# Patient Record
Sex: Female | Born: 1995
Health system: Southern US, Community
[De-identification: ages and names within clinical notes are randomized; demographics above are authoritative.]

---
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
## 2015-07-01 ENCOUNTER — Emergency Department (HOSPITAL_BASED_OUTPATIENT_CLINIC_OR_DEPARTMENT_OTHER)
Admission: EM | Admit: 2015-07-01 | Discharge: 2015-07-01 | Disposition: A | Discharge status: Home / Self Care | Payer: Medicaid Other | Attending: Emergency Medicine | Admitting: Emergency Medicine

## 2015-07-01 ENCOUNTER — Encounter (HOSPITAL_BASED_OUTPATIENT_CLINIC_OR_DEPARTMENT_OTHER): Payer: Self-pay | Admitting: Emergency Medicine

## 2015-07-01 DIAGNOSIS — Z791 Long term (current) use of non-steroidal anti-inflammatories (NSAID): Secondary | ICD-10-CM | POA: Insufficient documentation

## 2015-07-01 DIAGNOSIS — Z87891 Personal history of nicotine dependence: Secondary | ICD-10-CM | POA: Insufficient documentation

## 2015-07-01 DIAGNOSIS — M542 Cervicalgia: Secondary | ICD-10-CM | POA: Insufficient documentation

## 2015-07-01 DIAGNOSIS — Z79899 Other long term (current) drug therapy: Secondary | ICD-10-CM | POA: Insufficient documentation

## 2015-07-01 MED ORDER — IBUPROFEN 400 MG PO TABS
400.0000 mg | ORAL_TABLET | Freq: Once | ORAL | Status: AC
  Administered 2015-07-01: 400 mg via ORAL
  Filled 2015-07-01: qty 1

## 2015-07-01 NOTE — ED Notes (Signed)
Patient states that she has had "back problems for a while"  - unable to tell this RN for how long. Today however she started to have sharp pains in her left upper neck to shoulder region

## 2015-07-01 NOTE — ED Provider Notes (Signed)
CSN: [id number]     Arrival date & time 07/01/15  2043 History   First MD Initiated Contact with Patient 07/01/15 2115     Chief Complaint  Patient presents with  . Neck Pain     (Consider location/radiation/quality/duration/timing/severity/associated sxs/prior Treatment) HPI  Blood pressure 123/77, pulse 73, temperature 98.2 F (36.8 C), temperature source Oral, resp. rate 16, height  (1.626 m), weight 139 lb (63.05 kg), last menstrual period 07/01/2015, SpO2 100 %.  Lindsay Shah is a 19 y.o. female complaining of left-sided neck pain which states that she's had intermittently and chronically but had an exacerbation this morning. There was no trauma. She describes it as sharp and radiating to the occipital area. She denies headache, fever, chills, pain with neck movement. She rates her pain at 8 out of 10 in no pain medication was taken prior to arrival  History reviewed. No pertinent past medical history. History reviewed. No pertinent past surgical history. History reviewed. No pertinent family history. Social History  Substance Use Topics  . Smoking status: Former Games developer  . Smokeless tobacco: None  . Alcohol Use: No   OB History    No data available     Review of Systems  10 systems reviewed and found to be negative, except as noted in the HPI.   Allergies  Pineapple  Home Medications   Prior to Admission medications   Medication Sig Start Date End Date Taking? Authorizing Provider  famotidine (PEPCID) 20 MG tablet Take 1 tablet (20 mg total) by mouth 2 (two) times daily. 05/02/14   Vanetta Mulders, MD  naproxen (NAPROSYN) 500 MG tablet Take 1 tablet (500 mg total) by mouth 2 (two) times daily. 05/02/14   Vanetta Mulders, MD   BP 123/77 mmHg  Pulse 73  Temp(Src) 98.2 F (36.8 C) (Oral)  Resp 16  Ht  (1.626 m)  Wt 139 lb (63.05 kg)  BMI 23.85 kg/m2  SpO2 100%  LMP 07/01/2015 Physical Exam  Constitutional: She is oriented to person, place, and  time. She appears well-developed and well-nourished.  HENT:  Head: Normocephalic and atraumatic.  Mouth/Throat: Oropharynx is clear and moist.  Eyes: Conjunctivae and EOM are normal. Pupils are equal, round, and reactive to light.  No TTP of maxillary or frontal sinuses  No TTP or induration of temporal arteries bilaterally  Neck: Normal range of motion. Neck supple.  FROM to C-spine. Pt can touch chin to chest without discomfort. No TTP of midline cervical spine.   Cardiovascular: Normal rate, regular rhythm and intact distal pulses.   Pulmonary/Chest: Effort normal and breath sounds normal. No respiratory distress. She has no wheezes. She has no rales. She exhibits no tenderness.  Abdominal: Soft. Bowel sounds are normal. There is no tenderness.  Musculoskeletal: Normal range of motion. She exhibits no edema or tenderness.       Back:  Neurological: She is alert and oriented to person, place, and time. No cranial nerve deficit.  Follows commands, Clear, goal oriented speech, Strength is 5 out of 5x4 extremities, patient ambulates with a coordinated in nonantalgic gait. Sensation is grossly intact.    Nursing note and vitals reviewed.   ED Course  Procedures (including critical care time) Labs Review Labs Reviewed - No data to display  Imaging Review No results found. I have personally reviewed and evaluated these images and lab results as part of my medical decision-making.   EKG Interpretation None      MDM   Final diagnoses:  Cervical pain (neck)    Filed Vitals:   07/01/15 2047  BP: 123/77  Pulse: 73  Temp: 98.2 F (36.8 C)  TempSrc: Oral  Resp: 16  Height: 5\' 4"  (1.626 m)  Weight: 139 lb (63.05 kg)  SpO2: 100%    Medications  ibuprofen (ADVIL,MOTRIN) tablet 400 mg (400 mg Oral Given 07/01/15 2127)    [name] is 19 y.o. female presenting with point tenderness to palpation on left upper back. No cervical stiffness or meningeal signs, patient  afebrile. Advised patient to take high-dose Motrin and follow closely with primary care physician.  Evaluation does not show pathology that would require ongoing emergent intervention or inpatient treatment. Pt is hemodynamically stable and mentating appropriately. Discussed findings and plan with patient/guardian, who agrees with care plan. All questions answered. Return precautions discussed and outpatient follow up given.      Wynetta Emeryicole Wyn [place]ettle, PA-C 07/02/15 0008  Glynn OctaveStephen Rancour, MD 07/02/15 Moses [id number]

## 2015-07-01 NOTE — Discharge Instructions (Signed)
For pain control please take ibuprofen (also known as Motrin or Advil)  (this is normally 4 over the counter pills) 3 times a day  for 5 days. Take with food to minimize stomach irritation.  Do not hesitate to return to the emergency room for any new, worsening or concerning symptoms.  Please obtain primary care using resource guide below. Let them know that you were seen in the emergency room and that they will need to obtain records for further outpatient management.  Cervical Strain and Sprain With Rehab Cervical strain and sprain are injuries that commonly occur with "whiplash" injuries. Whiplash occurs when the neck is forcefully whipped backward or forward, such as during a motor vehicle accident or during contact sports. The muscles, ligaments, tendons, discs, and nerves of the neck are susceptible to injury when this occurs. RISK FACTORS Risk of having a whiplash injury increases if:  Osteoarthritis of the spine.  Situations that make head or neck accidents or trauma more likely.  High-risk sports (football, rugby, wrestling, hockey, auto racing, gymnastics, diving, contact karate, or boxing).  Poor strength and flexibility of the neck.  Previous neck injury.  Poor tackling technique.  Improperly fitted or padded equipment. SYMPTOMS   Pain or stiffness in the front or back of neck or both.  Symptoms may present immediately or up to 24 hours after injury.  Dizziness, headache, nausea, and vomiting.  Muscle spasm with soreness and stiffness in the neck.  Tenderness and swelling at the injury site. PREVENTION  Learn and use proper technique (avoid tackling with the head, spearing, and head-butting; use proper falling techniques to avoid landing on the head).  Warm up and stretch properly before activity.  Maintain physical fitness:  Strength, flexibility, and endurance.  Cardiovascular fitness.  Wear properly fitted and padded protective equipment, such as  padded soft collars, for participation in contact sports. PROGNOSIS  Recovery from cervical strain and sprain injuries is dependent on the extent of the injury. These injuries are usually curable in 1 week to 3 months with appropriate treatment.  RELATED COMPLICATIONS   Temporary numbness and weakness may occur if the nerve roots are damaged, and this may persist until the nerve has completely healed.  Chronic pain due to frequent recurrence of symptoms.  Prolonged healing, especially if activity is resumed too soon (before complete recovery). TREATMENT  Treatment initially involves the use of ice and medication to help reduce pain and inflammation. It is also important to perform strengthening and stretching exercises and modify activities that worsen symptoms so the injury does not get worse. These exercises may be performed at home or with a therapist. For patients who experience severe symptoms, a soft, padded collar may be recommended to be worn around the neck.  Improving your posture may help reduce symptoms. Posture improvement includes pulling your chin and abdomen in while sitting or standing. If you are sitting, sit in a firm chair with your buttocks against the back of the chair. While sleeping, try replacing your pillow with a small towel rolled to 2 inches in diameter, or use a cervical pillow or soft cervical collar. Poor sleeping positions delay healing.  For patients with nerve root damage, which causes numbness or weakness, the use of a cervical traction apparatus may be recommended. Surgery is rarely necessary for these injuries. However, cervical strain and sprains that are present at birth (congenital) may require surgery. MEDICATION   If pain medication is necessary, nonsteroidal anti-inflammatory medications, such as aspirin and ibuprofen,  or other minor pain relievers, such as acetaminophen, are often recommended.  Do not take pain medication for 7 days before  surgery.  Prescription pain relievers may be given if deemed necessary by your caregiver. Use only as directed and only as much as you need. HEAT AND COLD:   Cold treatment (icing) relieves pain and reduces inflammation. Cold treatment should be applied for 10 to 15 minutes every 2 to 3 hours for inflammation and pain and immediately after any activity that aggravates your symptoms. Use ice packs or an ice massage.  Heat treatment may be used prior to performing the stretching and strengthening activities prescribed by your caregiver, physical therapist, or athletic trainer. Use a heat pack or a warm soak. SEEK MEDICAL CARE IF:   Symptoms get worse or do not improve in 2 weeks despite treatment.  New, unexplained symptoms develop (drugs used in treatment may produce side effects). EXERCISES RANGE OF MOTION (ROM) AND STRETCHING EXERCISES - Cervical Strain and Sprain These exercises may help you when beginning to rehabilitate your injury. In order to successfully resolve your symptoms, you must improve your posture. These exercises are designed to help reduce the forward-head and rounded-shoulder posture which contributes to this condition. Your symptoms may resolve with or without further involvement from your physician, physical therapist or athletic trainer. While completing these exercises, remember:   Restoring tissue flexibility helps normal motion to return to the joints. This allows healthier, less painful movement and activity.  An effective stretch should be held for at least 20 seconds, although you may need to begin with shorter hold times for comfort.  A stretch should never be painful. You should only feel a gentle lengthening or release in the stretched tissue. STRETCH- Axial Extensors  Lie on your back on the floor. You may bend your knees for comfort. Place a rolled-up hand towel or dish towel, about 2 inches in diameter, under the part of your head that makes contact with the  floor.  Gently tuck your chin, as if trying to make a "double chin," until you feel a gentle stretch at the base of your head.  Hold __________ seconds. Repeat __________ times. Complete this exercise __________ times per day.  STRETCH - Axial Extension   Stand or sit on a firm surface. Assume a good posture: chest up, shoulders drawn back, abdominal muscles slightly tense, knees unlocked (if standing) and feet hip width apart.  Slowly retract your chin so your head slides back and your chin slightly lowers. Continue to look straight ahead.  You should feel a gentle stretch in the back of your head. Be certain not to feel an aggressive stretch since this can cause headaches later.  Hold for __________ seconds. Repeat __________ times. Complete this exercise __________ times per day. STRETCH - Cervical Side Bend   Stand or sit on a firm surface. Assume a good posture: chest up, shoulders drawn back, abdominal muscles slightly tense, knees unlocked (if standing) and feet hip width apart.  Without letting your nose or shoulders move, slowly tip your right / left ear to your shoulder until your feel a gentle stretch in the muscles on the opposite side of your neck.  Hold __________ seconds. Repeat __________ times. Complete this exercise __________ times per day. STRETCH - Cervical Rotators   Stand or sit on a firm surface. Assume a good posture: chest up, shoulders drawn back, abdominal muscles slightly tense, knees unlocked (if standing) and feet hip width apart.  Keeping your  eyes level with the ground, slowly turn your head until you feel a gentle stretch along the back and opposite side of your neck.  Hold __________ seconds. Repeat __________ times. Complete this exercise __________ times per day. RANGE OF MOTION - Neck Circles   Stand or sit on a firm surface. Assume a good posture: chest up, shoulders drawn back, abdominal muscles slightly tense, knees unlocked (if standing) and  feet hip width apart.  Gently roll your head down and around from the back of one shoulder to the back of the other. The motion should never be forced or painful.  Repeat the motion 10-20 times, or until you feel the neck muscles relax and loosen. Repeat __________ times. Complete the exercise __________ times per day. STRENGTHENING EXERCISES - Cervical Strain and Sprain These exercises may help you when beginning to rehabilitate your injury. They may resolve your symptoms with or without further involvement from your physician, physical therapist, or athletic trainer. While completing these exercises, remember:   Muscles can gain both the endurance and the strength needed for everyday activities through controlled exercises.  Complete these exercises as instructed by your physician, physical therapist, or athletic trainer. Progress the resistance and repetitions only as guided.  You may experience muscle soreness or fatigue, but the pain or discomfort you are trying to eliminate should never worsen during these exercises. If this pain does worsen, stop and make certain you are following the directions exactly. If the pain is still present after adjustments, discontinue the exercise until you can discuss the trouble with your clinician. STRENGTH - Cervical Flexors, Isometric  Face a wall, standing about 6 inches away. Place a small pillow, a ball about 6-8 inches in diameter, or a folded towel between your forehead and the wall.  Slightly tuck your chin and gently push your forehead into the soft object. Push only with mild to moderate intensity, building up tension gradually. Keep your jaw and forehead relaxed.  Hold 10 to 20 seconds. Keep your breathing relaxed.  Release the tension slowly. Relax your neck muscles completely before you start the next repetition. Repeat __________ times. Complete this exercise __________ times per day. STRENGTH- Cervical Lateral Flexors, Isometric   Stand  about 6 inches away from a wall. Place a small pillow, a ball about 6-8 inches in diameter, or a folded towel between the side of your head and the wall.  Slightly tuck your chin and gently tilt your head into the soft object. Push only with mild to moderate intensity, building up tension gradually. Keep your jaw and forehead relaxed.  Hold 10 to 20 seconds. Keep your breathing relaxed.  Release the tension slowly. Relax your neck muscles completely before you start the next repetition. Repeat __________ times. Complete this exercise __________ times per day. STRENGTH - Cervical Extensors, Isometric   Stand about 6 inches away from a wall. Place a small pillow, a ball about 6-8 inches in diameter, or a folded towel between the back of your head and the wall.  Slightly tuck your chin and gently tilt your head back into the soft object. Push only with mild to moderate intensity, building up tension gradually. Keep your jaw and forehead relaxed.  Hold 10 to 20 seconds. Keep your breathing relaxed.  Release the tension slowly. Relax your neck muscles completely before you start the next repetition. Repeat __________ times. Complete this exercise __________ times per day. POSTURE AND BODY MECHANICS CONSIDERATIONS - Cervical Strain and Sprain Keeping correct posture when  sitting, standing or completing your activities will reduce the stress put on different body tissues, allowing injured tissues a chance to heal and limiting painful experiences. The following are general guidelines for improved posture. Your physician or physical therapist will provide you with any instructions specific to your needs. While reading these guidelines, remember:  The exercises prescribed by your provider will help you have the flexibility and strength to maintain correct postures.  The correct posture provides the optimal environment for your joints to work. All of your joints have less wear and tear when properly  supported by a spine with good posture. This means you will experience a healthier, less painful body.  Correct posture must be practiced with all of your activities, especially prolonged sitting and standing. Correct posture is as important when doing repetitive low-stress activities (typing) as it is when doing a single heavy-load activity (lifting). PROLONGED STANDING WHILE SLIGHTLY LEANING FORWARD When completing a task that requires you to lean forward while standing in one place for a long time, place either foot up on a stationary 2- to 4-inch high object to help maintain the best posture. When both feet are on the ground, the low back tends to lose its slight inward curve. If this curve flattens (or becomes too large), then the back and your other joints will experience too much stress, fatigue more quickly, and can cause pain.  RESTING POSITIONS Consider which positions are most painful for you when choosing a resting position. If you have pain with flexion-based activities (sitting, bending, stooping, squatting), choose a position that allows you to rest in a less flexed posture. You would want to avoid curling into a fetal position on your side. If your pain worsens with extension-based activities (prolonged standing, working overhead), avoid resting in an extended position such as sleeping on your stomach. Most people will find more comfort when they rest with their spine in a more neutral position, neither too rounded nor too arched. Lying on a non-sagging bed on your side with a pillow between your knees, or on your back with a pillow under your knees will often provide some relief. Keep in mind, being in any one position for a prolonged period of time, no matter how correct your posture, can still lead to stiffness. WALKING Walk with an upright posture. Your ears, shoulders, and hips should all line up. OFFICE WORK When working at a desk, create an environment that supports good, upright  posture. Without extra support, muscles fatigue and lead to excessive strain on joints and other tissues. CHAIR:  A chair should be able to slide under your desk when your back makes contact with the back of the chair. This allows you to work closely.  The chair's height should allow your eyes to be level with the upper part of your monitor and your hands to be slightly lower than your elbows.  Body position:  Your feet should make contact with the floor. If this is not possible, use a foot rest.  Keep your ears over your shoulders. This will reduce stress on your neck and low back.   This information is not intended to replace advice given to you by your health care provider. Make sure you discuss any questions you have with your health care provider.   Document Released: 09/01/2005 Document Revised: 09/22/2014 Document Reviewed: 12/14/2008 Elsevier Interactive Patient Education 2016 [employer]    Emergency Department Resource Guide 1) Find a Librarian, academicDoctor and Pay Out of Pocket Although you  won't have to find out who is covered by your insurance plan, it is a good idea to ask around and get recommendations. You will then need to call the office and see if the doctor you have chosen will accept you as a new patient and what types of options they offer for patients who are self-pay. Some doctors offer discounts or will set up payment plans for their patients who do not have insurance, but you will need to ask so you aren't surprised when you get to your appointment.  2) Contact Your Local Health Department Not all health departments have doctors that can see patients for sick visits, but many do, so it is worth a call to see if yours does. If you don't know where your local health department is, you can check in your phone book. The CDC also has a tool to help you locate your state's health department, and many state websites also have listings of all of their local health departments.  3)  Find a Walk-in Clinic If your illness is not likely to be very severe or complicated, you may want to try a walk in clinic. These are popping up all over the country in pharmacies, drugstores, and shopping centers. They're usually staffed by nurse practitioners or physician assistants that have been trained to treat common illnesses and complaints. They're usually fairly quick and inexpensive. However, if you have serious medical issues or chronic medical problems, these are probably not your best option.  No Primary Care Doctor: - Call Health Connect at  [phone] - they can help you locate a primary care doctor that  accepts your insurance, provides certain services, etc. - Physician Referral Service- [phone]  Chronic Pain Problems: Organization         Address  Phone   Notes  [facility]  [phone] Patients need to be referred by their primary care doctor.   Medication Assistance: Organization         Address  Phone   Notes  [facility] Medication [facility] [address] [place] [place], [place] [address] [phone] --Must be a resident of [facility] -- Must have NO insurance coverage whatsoever (no Medicaid/ Medicare, etc.) -- The pt. MUST have a primary care doctor that directs their care regularly and follows them in the community   [facility]  [phone]   [employer]  [phone]    Agencies that provide inexpensive medical care: Organization         Address  Phone   Notes  [facility]  [phone]   [facility]    [phone]   [facility] [address] [place], [place] [address] [phone]   [facility] [address], [place] [phone]   [facility]    [phone]   [facility]    [phone]   [facility]  [address], [place] Phone:  [phone], Fax:  [phone] Hours of Operation:  9 am - 6 pm, M-F.  Also accepts Medicaid/Medicare and self-pay.  [facility]  [address], [place] Phone: [phone], Fax: [phone]. Hours of Operation:  8:30 am - 5:30 pm, M-F.  Also accepts Medicaid and self-pay.  [facility] [place] [address] [employer] Phone: [phone]   [facility] [address] [place], [place] [phone], Ext. [phone] Mondays & Thursdays: 7-9 AM.  First 15 patients are seen on a first come, first serve basis.    Medicaid-accepting [facility] Providers:  Organization         Address  Phone   Notes  [facility] [address], [place] [phone] Also accepts self-pay patients.  [facility] [address] Laurell Josephs [place], [place]  [phone]   [facility] [address], [place] [phone]   [facility] [address], [place] [phone]   Renaye Rakers [address], [place]   [phone] Only accepts [place] patients after they have their name applied to their card.   Self-Pay (no insurance) in [facility]:  Organization         Address  Phone   Notes  Sickle Cell Patients, [facility] [address] [place], [place] [phone]   [facility] [address] [place], [place] [phone]   [facility]  [address], [place] [phone]   [facility]/Dr. Osei-Bonsu  [address], [place] or [address], [place] [phone] Phone number for both [place] and [place] locations is the same.  [facility] [address] [place] [phone]   [facility] [address] [place] or [address] [phone]    [facility] [address], [place] [phone], phone; [phone], fax Sees patients 1st and 3rd Saturday of every month.  Must not qualify for public or private insurance (i.e. Medicaid, Medicare, [place] Health Choice, Veterans' Benefits)  Household income should be no more than 200% of the poverty level The clinic cannot treat you if you are pregnant or think you are pregnant  Sexually transmitted diseases are not treated at the clinic.    Dental Care: Organization         Address  Phone  Notes  [facility] Department of [facility] [address] [place], [place] [phone] Accepts children up to age 21 who are enrolled in [place] or [place] Health Choice; pregnant women with a Medicaid card; and children who have applied for Medicaid or [place] Health Choice, but were declined, whose parents can pay a reduced fee at time of service.  [facility] of [facility]  [address], [place] [phone] Accepts children up to age 61 who are enrolled in [place] or [place] Health Choice; pregnant women with a Medicaid card; and children who have applied for Medicaid or  Health Choice, but were declined, whose parents can pay a reduced fee at time of service.  [facility]  [address] [place], [place] [phone] Patients are seen by appointment only. Walk-ins are not accepted. [facility] will see patients 51 years of age and older. Monday - Tuesday (8am-5pm) Most Wednesdays (8:30-5pm) $30 per visit, cash only  [facility]  [address], [facility] [phone] Patients are seen by appointment only. Walk-ins are not accepted. [facility] will see patients 34 years of age and older. One Wednesday Evening (Monthly: Volunteer Based).  $30 per visit, cash only  [employer] of  Dentistry Clinics  [phone] for adults; Children under age 3, call  [facility] at [phone]. Children aged 11-14, please call [phone] to request a pediatric application.  Dental services are provided in all areas of dental care including fillings, crowns and bridges, complete and partial dentures, implants, gum treatment, root canals, and extractions. Preventive care is also provided. Treatment is provided to both adults and children. Patients are selected via a lottery and there is often a waiting list.   [facility] [address] [place]  [phone] [email or web address]   [facility] [address] [place], [place] [phone], Ext. [phone] Second and Fourth Thursday of each month, opens at 6:30 AM; Clinic ends at 9 AM.  Patients are seen on a first-come first-served basis, and a limited number are seen during each clinic.   [facility]  [address] Ether Griffins [place], [place] [phone]   Eligibility Requirements You must have lived in [place], [place], or [place] counties for at least the last three months.   You cannot be eligible for state or federal sponsored [place], including [employer], [place], or [employer].   You generally cannot be eligible for healthcare insurance through your employer.    How to apply: Eligibility screenings are held every Tuesday and Wednesday afternoon from 1:00 pm until 4:00 pm. You do not need an appointment for the interview!  [facility] [address], [place], [place] [phone]   [facility] Health Department  [phone]   [facility] Health Department  [phone]   [facility] Department  [phone]    Behavioral Health Resources in the Community: Intensive Outpatient Programs Organization         Address  Phone  Notes  [facility] Services [address], [place], [place] [phone]   [facility] [address], [place], [place]  [phone]   [facility] [address], [place], [place]  [phone]   [facility] [address],  [place], [place] [phone] or [phone]   Substance Abuse Resources Organization         Address  Phone  Notes  [facility]  [phone]   [facility]  [phone]   [facility]  [phone]   Floydene Flock  [phone]   Residential & Outpatient Substance Abuse Program  [phone]   Psychological Services Organization         Address  Phone  Notes  [facility]  [phone]   [facility] Services  [phone]   [facility] [address], [place] [phone] or [phone]    Mobile Crisis Teams Organization         Address  Phone  Notes  [facility], Mobile Crisis Care Unit  [phone]   [facility]  [address] [place], [place] [phone]   Doristine Locks [address] [place] [phone]    Self-Help/Support Groups Organization         Address  Phone             Notes  [facility] - variety of support groups  [phone] Call for more information  Narcotics Anonymous (NA), [facility] [address], [employer] [place]  2 meetings at this location   [employer]  Phone  Notes  [facility] [address]    [place]  [phone]   [facility]  [address], [place] [address], [place], [place] [phone]   [facility] [address] [place], [place] [phone] Admissions: 8am-3pm M-F  [facility] [address],    [place], [place] [phone]   [facility] [address] [place], [place], [place] [phone]   [facility] [address],  [place], [place] [phone]   [facility] - Intensive Outpatient [address], [place], [place] [phone]   [facility] ([facility] [address] [place]  [place], [place] [phone] or [phone]   [facility] ([facility] [address] [place] [address], [place], [place] [phone] Accepts Medicaid  [facility] [address]  [place] [phone] Substance Abuse/Addiction Treatment   [facility] Organization         Address  Phone  Notes  [facility]  [phone]   Angie Fava, PhD [address] Ervin Knack [place], [place]   [phone] or [phone]   [facility]   [address] [place], [place] [phone]   [facility] [address] [place], [place] [phone] Insurance/Medicaid/sponsorship through [facility] [address]                                    [place], [place] [phone] Therapy/tele-psych/case  [facility] [address], [place] [phone]    Dr. Lolly Mustache  [phone]   [facility]  [employer] [facility] Dept. 1) [address], [place] 2) [address], [place] 3)  [address], [place] [phone]  [phone]   [facility] Child Abuse Hotline [phone] or [phone] (After Hours)

## 2015-07-17 ENCOUNTER — Encounter (HOSPITAL_BASED_OUTPATIENT_CLINIC_OR_DEPARTMENT_OTHER): Payer: Self-pay | Admitting: Emergency Medicine

## 2015-07-17 ENCOUNTER — Emergency Department (HOSPITAL_BASED_OUTPATIENT_CLINIC_OR_DEPARTMENT_OTHER)
Admission: EM | Admit: 2015-07-17 | Discharge: 2015-07-17 | Disposition: A | Discharge status: Home / Self Care | Payer: Medicaid Other | Attending: Emergency Medicine | Admitting: Emergency Medicine

## 2015-07-17 ENCOUNTER — Emergency Department (HOSPITAL_BASED_OUTPATIENT_CLINIC_OR_DEPARTMENT_OTHER): Payer: Medicaid Other

## 2015-07-17 DIAGNOSIS — Y9389 Activity, other specified: Secondary | ICD-10-CM | POA: Insufficient documentation

## 2015-07-17 DIAGNOSIS — Y998 Other external cause status: Secondary | ICD-10-CM | POA: Insufficient documentation

## 2015-07-17 DIAGNOSIS — S46912A Strain of unspecified muscle, fascia and tendon at shoulder and upper arm level, left arm, initial encounter: Secondary | ICD-10-CM | POA: Insufficient documentation

## 2015-07-17 DIAGNOSIS — Z87891 Personal history of nicotine dependence: Secondary | ICD-10-CM | POA: Insufficient documentation

## 2015-07-17 DIAGNOSIS — Y9289 Other specified places as the place of occurrence of the external cause: Secondary | ICD-10-CM | POA: Insufficient documentation

## 2015-07-17 DIAGNOSIS — M25512 Pain in left shoulder: Secondary | ICD-10-CM

## 2015-07-17 DIAGNOSIS — W228XXA Striking against or struck by other objects, initial encounter: Secondary | ICD-10-CM | POA: Insufficient documentation

## 2015-07-17 MED ORDER — NAPROXEN 500 MG PO TABS: 500.0000 mg | ORAL_TABLET | Freq: Two times a day (BID) | ORAL | Status: AC | PRN

## 2015-07-17 MED ORDER — HYDROCODONE-ACETAMINOPHEN 5-325 MG PO TABS
1.0000 | ORAL_TABLET | Freq: Once | ORAL | Status: AC
  Administered 2015-07-17: 1 via ORAL
  Filled 2015-07-17: qty 1

## 2015-07-17 MED ORDER — HYDROCODONE-ACETAMINOPHEN 5-325 MG PO TABS: 1.0000 | ORAL_TABLET | Freq: Four times a day (QID) | ORAL | Status: AC | PRN
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
## 2015-07-17 NOTE — ED Notes (Signed)
Pt verbalizes understanding of d/c instructions and denies any further need at this time. 
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
## 2015-07-17 NOTE — Discharge Instructions (Signed)
Wear shoulder sling for no more than 3 days, then begin performing gentle range of motion exercises. Ice your shoulder throughout the day, using an ice pack for 20 minutes at a time every hour. Alternate between naprosyn and norco for pain relief. Do not drive or operate machinery with pain medication use. Call orthopedic follow up today or tomorrow to schedule followup appointment for recheck of ongoing shoulder pain in 1-2 weeks that can be canceled with a 24-48 hour notice if complete resolution of pain. Return to the ER for changes or worsening symptoms.    Shoulder Pain The shoulder is the joint that connects your arm to your body. Muscles and band-like tissues that connect bones to muscles (tendons) hold the joint together. Shoulder pain is felt if an injury or medical problem affects one or more parts of the shoulder. HOME CARE   Put ice on the sore area.  Put ice in a plastic bag.  Place a towel between your skin and the bag.  Leave the ice on for 15-20 minutes, 03-04 times a day for the first 2 days.  Stop using cold packs if they do not help with the pain.  If you were given something to keep your shoulder from moving (sling; shoulder immobilizer), wear it as told. Only take it off to shower or bathe.  Move your arm as little as possible, but keep your hand moving to prevent puffiness (swelling).  Squeeze a soft ball or foam pad as much as possible to help prevent swelling.  Take medicine as told by your doctor. GET HELP IF:  You have progressing new pain in your arm, hand, or fingers.  Your hand or fingers get cold.  Your medicine does not help lessen your pain. GET HELP RIGHT AWAY IF:   Your arm, hand, or fingers are numb or tingling.  Your arm, hand, or fingers are puffy (swollen), painful, or turn white or blue. MAKE SURE YOU:   Understand these instructions.  Will watch your condition.  Will get help right away if you are not doing well or get worse.   This  information is not intended to replace advice given to you by your health care provider. Make sure you discuss any questions you have with your health care provider.   Document Released: 02/18/2008 Document Revised: 09/22/2014 Document Reviewed: 12/25/2014 Elsevier Interactive Patient Education 2016 Elsevier Inc.  Tendon Injury Tendons are strong, cordlike structures that connect muscle to bone. Tendons are made up of woven fibers, like a rope. A tendon injury is a tear (rupture) of the tendon. The rupture may be partial (only a few of the fibers in your tendon rupture) or complete (your entire tendon ruptures). CAUSES  Tendon injuries can be caused by high-stress activities, such as sports. They also can be caused by a repetitive injury or by a single injury from an excessive, rapid force. SYMPTOMS  Symptoms of tendon injury include pain when you move the joint close to the tendon. Other symptoms are swelling, redness, and warmth. DIAGNOSIS  Tendon injuries often can be diagnosed by physical exam. However, sometimes an X-ray exam or advanced imaging, such as magnetic resonance imaging (MRI), is necessary to determine the extent of the injury. TREATMENT  Partial tendon ruptures often can be treated with immobilization. A splint, bandage, or removable brace usually is used to immobilize the injured tendon. Most injured tendons need to be immobilized for 1-2 months before they are completely healed. Complete tendon ruptures may require surgical  reattachment.   This information is not intended to replace advice given to you by your health care provider. Make sure you discuss any questions you have with your health care provider.   Document Released: 10/09/2004 Document Revised: 08/21/2011 Document Reviewed: 11/23/2011 Elsevier Interactive Patient Education 2016 Elsevier Inc.   Shoulder Range of Motion Exercises Shoulder range of motion (ROM) exercises are designed to keep the shoulder moving  freely. They are often recommended for people who have shoulder pain. MOVEMENT EXERCISE When you are able, do this exercise 5-6 days per week, or as told by your health care provider. Work toward doing 2 sets of 10 swings. Pendulum Exercise How To Do This Exercise Lying Down  Lie face-down on a bed with your abdomen close to the side of the bed.  Let your arm hang over the side of the bed.  Relax your shoulder, arm, and hand.  Slowly and gently swing your arm forward and back. Do not use your neck muscles to swing your arm. They should be relaxed. If you are struggling to swing your arm, have someone gently swing it for you. When you do this exercise for the first time, swing your arm at a 15 degree angle for 15 seconds, or swing your arm 10 times. As pain lessens over time, increase the angle of the swing to 30-45 degrees.  Repeat steps 1-4 with the other arm. How To Do This Exercise While Standing  Stand next to a sturdy chair or table and hold on to it with your hand.  Bend forward at the waist.  Bend your knees slightly.  Relax your other arm and let it hang limp.  Relax the shoulder blade of the arm that is hanging and let it drop.  While keeping your shoulder relaxed, use body motion to swing your arm in small circles. The first time you do this exercise, swing your arm for about 30 seconds or 10 times. When you do it next time, swing your arm for a little longer.  Stand up tall and relax.  Repeat steps 1-7, this time changing the direction of the circles.  Repeat steps 1-8 with the other arm. STRETCHING EXERCISES Do these exercises 3-4 times per day on 5-6 days per week or as told by your health care provider. Work toward holding the stretch for 20 seconds. Stretching Exercise 1  Lift your arm straight out in front of you.  Bend your arm 90 degrees at the elbow (right angle) so your forearm goes across your body and looks like the letter "L."  Use your other arm to  gently pull the elbow forward and across your body.  Repeat steps 1-3 with the other arm. Stretching Exercise 2 You will need a towel or rope for this exercise.  Bend one arm behind your back with the palm facing outward.  Hold a towel with your other hand.  Reach the arm that holds the towel above your head, and bend that arm at the elbow. Your wrist should be behind your neck.  Use your free hand to grab the free end of the towel.  With the higher hand, gently pull the towel up behind you.  With the lower hand, pull the towel down behind you.  Repeat steps 1-6 with the other arm. STRENGTHENING EXERCISES Do each of these exercises at four different times of day (sessions) every day or as told by your health care provider. To begin with, repeat each exercise 5 times (repetitions). Work  toward doing 3 sets of 12 repetitions or as told by your health care provider. Strengthening Exercise 1 You will need a light weight for this activity. As you grow stronger, you may use a heavier weight.  Standing with a weight in your hand, lift your arm straight out to the side until it is at the same height as your shoulder.  Bend your arm at 90 degrees so that your fingers are pointing to the ceiling.  Slowly raise your hand until your arm is straight up in the air.  Repeat steps 1-3 with the other arm. Strengthening Exercise 2 You will need a light weight for this activity. As you grow stronger, you may use a heavier weight.  Standing with a weight in your hand, gradually move your straight arm in an arc, starting at your side, then out in front of you, then straight up over your head.  Gradually move your other arm in an arc, starting at your side, then out in front of you, then straight up over your head.  Repeat steps 1-2 with the other arm. Strengthening Exercise 3 You will need an elastic band for this activity. As you grow stronger, gradually increase the size of the bands or increase  the number of bands that you use at one time. 1. While standing, hold an elastic band in one hand and raise that arm up in the air. 2. With your other hand, pull down the band until that hand is by your side. 3. Repeat steps 1-2 with the other arm.   This information is not intended to replace advice given to you by your health care provider. Make sure you discuss any questions you have with your health care provider.   Document Released: 05/31/2003 Document Revised: 01/16/2015 Document Reviewed: 08/28/2014 Elsevier Interactive Patient Education 2016 Elsevier Inc.  Cryotherapy Cryotherapy means treatment with cold. Ice or gel packs can be used to reduce both pain and swelling. Ice is the most helpful within the first 24 to 48 hours after an injury or flare-up from overusing a muscle or joint. Sprains, strains, spasms, burning pain, shooting pain, and aches can all be eased with ice. Ice can also be used when recovering from surgery. Ice is effective, has very few side effects, and is safe for most people to use. PRECAUTIONS  Ice is not a safe treatment option for people with:  Raynaud phenomenon. This is a condition affecting small blood vessels in the extremities. Exposure to cold may cause your problems to return.  Cold hypersensitivity. There are many forms of cold hypersensitivity, including:  Cold urticaria. Red, itchy hives appear on the skin when the tissues begin to warm after being iced.  Cold erythema. This is a red, itchy rash caused by exposure to cold.  Cold hemoglobinuria. Red blood cells break down when the tissues begin to warm after being iced. The hemoglobin that carry oxygen are passed into the urine because they cannot combine with blood proteins fast enough.  Numbness or altered sensitivity in the area being iced. If you have any of the following conditions, do not use ice until you have discussed cryotherapy with your caregiver:  Heart conditions, such as  arrhythmia, angina, or chronic heart disease.  High blood pressure.  Healing wounds or open skin in the area being iced.  Current infections.  Rheumatoid arthritis.  Poor circulation.  Diabetes. Ice slows the blood flow in the region it is applied. This is beneficial when trying to stop inflamed  tissues from spreading irritating chemicals to surrounding tissues. However, if you expose your skin to cold temperatures for too long or without the proper protection, you can damage your skin or nerves. Watch for signs of skin damage due to cold. HOME CARE INSTRUCTIONS Follow these tips to use ice and cold packs safely.  Place a dry or damp towel between the ice and skin. A damp towel will cool the skin more quickly, so you may need to shorten the time that the ice is used.  For a more rapid response, add gentle compression to the ice.  Ice for no more than 10 to 20 minutes at a time. The bonier the area you are icing, the less time it will take to get the benefits of ice.  Check your skin after 5 minutes to make sure there are no signs of a poor response to cold or skin damage.  Rest 20 minutes or more between uses.  Once your skin is numb, you can end your treatment. You can test numbness by very lightly touching your skin. The touch should be so light that you do not see the skin dimple from the pressure of your fingertip. When using ice, most people will feel these normal sensations in this order: cold, burning, aching, and numbness.  Do not use ice on someone who cannot communicate their responses to pain, such as small children or people with dementia. HOW TO MAKE AN ICE PACK Ice packs are the most common way to use ice therapy. Other methods include ice massage, ice baths, and cryosprays. Muscle creams that cause a cold, tingly feeling do not offer the same benefits that ice offers and should not be used as a substitute unless recommended by your caregiver. To make an ice pack, do one  of the following:  Place crushed ice or a bag of frozen vegetables in a sealable plastic bag. Squeeze out the excess air. Place this bag inside another plastic bag. Slide the bag into a pillowcase or place a damp towel between your skin and the bag.  Mix 3 parts water with 1 part rubbing alcohol. Freeze the mixture in a sealable plastic bag. When you remove the mixture from the freezer, it will be slushy. Squeeze out the excess air. Place this bag inside another plastic bag. Slide the bag into a pillowcase or place a damp towel between your skin and the bag. SEEK MEDICAL CARE IF:  You develop white spots on your skin. This may give the skin a blotchy (mottled) appearance.  Your skin turns blue or pale.  Your skin becomes waxy or hard.  Your swelling gets worse. MAKE SURE YOU:   Understand these instructions.  Will watch your condition.  Will get help right away if you are not doing well or get worse.   This information is not intended to replace advice given to you by your health care provider. Make sure you discuss any questions you have with your health care provider.   Document Released: 04/28/2011 Document Revised: 09/22/2014 Document Reviewed: 04/28/2011 Elsevier Interactive Patient Education [employer]
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
## 2016-12-23 ENCOUNTER — Emergency Department (HOSPITAL_BASED_OUTPATIENT_CLINIC_OR_DEPARTMENT_OTHER)
Admission: EM | Admit: 2016-12-23 | Discharge: 2016-12-23 | Disposition: A | Discharge status: Home / Self Care | Payer: Medicaid Other | Attending: Emergency Medicine | Admitting: Emergency Medicine

## 2016-12-23 ENCOUNTER — Encounter (HOSPITAL_BASED_OUTPATIENT_CLINIC_OR_DEPARTMENT_OTHER): Payer: Self-pay | Admitting: *Deleted

## 2016-12-23 DIAGNOSIS — Z87891 Personal history of nicotine dependence: Secondary | ICD-10-CM | POA: Insufficient documentation

## 2016-12-23 DIAGNOSIS — K529 Noninfective gastroenteritis and colitis, unspecified: Secondary | ICD-10-CM

## 2016-12-23 DIAGNOSIS — R112 Nausea with vomiting, unspecified: Secondary | ICD-10-CM | POA: Diagnosis present

## 2016-12-23 LAB — BASIC METABOLIC PANEL
Anion gap: 8 (ref 5–15)
BUN: 11 mg/dL (ref 6–20)
CHLORIDE: 102 mmol/L (ref 101–111)
CO2: 24 mmol/L (ref 22–32)
CREATININE: 0.76 mg/dL (ref 0.44–1.00)
Calcium: 8.6 mg/dL — ABNORMAL LOW (ref 8.9–10.3)
GFR calc non Af Amer: >60 mL/min (ref >60)
Glucose, Bld: 93 mg/dL (ref 65–99)
POTASSIUM: 3.4 mmol/L — AB (ref 3.5–5.1)
SODIUM: 134 mmol/L — AB (ref 135–145)

## 2016-12-23 LAB — PREGNANCY, URINE: Preg Test, Ur: NEGATIVE

## 2016-12-23 LAB — URINALYSIS, ROUTINE W REFLEX MICROSCOPIC
Bilirubin Urine: NEGATIVE
Glucose, UA: NEGATIVE mg/dL
Hgb urine dipstick: NEGATIVE
KETONES UR: NEGATIVE mg/dL
LEUKOCYTES UA: NEGATIVE
NITRITE: NEGATIVE
PH: 6.5 (ref 5.0–8.0)
Protein, ur: NEGATIVE mg/dL
Specific Gravity, Urine: 1.022 (ref 1.005–1.030)

## 2016-12-23 LAB — CBC WITH DIFFERENTIAL/PLATELET
BASOS PCT: 0 %
Basophils Absolute: 0 10*3/uL (ref 0.0–0.1)
Eosinophils Absolute: 0 10*3/uL (ref 0.0–0.7)
Eosinophils Relative: 0 %
HEMATOCRIT: 42.2 % (ref 36.0–46.0)
HEMOGLOBIN: 14.2 g/dL (ref 12.0–15.0)
LYMPHS ABS: 0.8 10*3/uL (ref 0.7–4.0)
Lymphocytes Relative: 8 %
MCH: 29.5 pg (ref 26.0–34.0)
MCHC: 33.6 g/dL (ref 30.0–36.0)
MCV: 87.6 fL (ref 78.0–100.0)
MONOS PCT: 10 %
Monocytes Absolute: 1 10*3/uL (ref 0.1–1.0)
NEUTROS ABS: 8.4 10*3/uL — AB (ref 1.7–7.7)
NEUTROS PCT: 82 %
Platelets: 309 10*3/uL (ref 150–400)
RBC: 4.82 MIL/uL (ref 3.87–5.11)
RDW: 12.2 % (ref 11.5–15.5)
WBC: 10.2 10*3/uL (ref 4.0–10.5)

## 2016-12-23 MED ORDER — SODIUM CHLORIDE 0.9 % IV BOLUS (SEPSIS)
1000.0000 mL | Freq: Once | INTRAVENOUS | Status: AC
  Administered 2016-12-23: 1000 mL via INTRAVENOUS

## 2016-12-23 MED ORDER — ONDANSETRON HCL 4 MG/2ML IJ SOLN
4.0000 mg | Freq: Once | INTRAMUSCULAR | Status: AC
  Administered 2016-12-23: 4 mg via INTRAVENOUS
  Filled 2016-12-23: qty 2

## 2016-12-23 MED ORDER — KETOROLAC TROMETHAMINE 30 MG/ML IJ SOLN
30.0000 mg | Freq: Once | INTRAMUSCULAR | Status: AC
  Administered 2016-12-23: 30 mg via INTRAVENOUS
  Filled 2016-12-23: qty 1

## 2016-12-23 MED ORDER — ONDANSETRON 8 MG PO TBDP: ORAL_TABLET | ORAL | 0 refills | Status: AC

## 2016-12-23 MED ORDER — ONDANSETRON 4 MG PO TBDP
4.0000 mg | ORAL_TABLET | Freq: Once | ORAL | Status: AC
  Administered 2016-12-23: 4 mg via ORAL
  Filled 2016-12-23: qty 1

## 2016-12-23 MED FILL — ONDANSETRON ODT 8 MG TABLET: 8 | 2 days supply | Qty: 6 | Fill #0

## 2016-12-23 NOTE — Discharge Instructions (Signed)
Zofran as prescribed as needed for nausea. ° °Clear liquid diet for the next 12 hours, then slowly advance to normal. ° °Return to the emergency department if you develop worsening pain, high fevers, bloody stools, or other new and concerning symptoms. °

## 2016-12-23 NOTE — ED Notes (Signed)
Nausea improved after Zofran. She is drinking water with no vomiting.

## 2016-12-23 NOTE — ED Provider Notes (Signed)
[facility]   CSN: [id number] Arrival date & time: 12/23/16  1112     History   Chief Complaint Chief Complaint  Patient presents with  . Emesis    HPI Lindsay Shah is a 21 y.o. female.  Patient is a 21 year old female with no significant past medical history. She presents with a 2 day history of nausea, vomiting, and diarrhea, all of which is been nonbloody. She reports generalized abdominal cramping, however no localized pain. She denies any ill contacts. She denies any urinary complaints. Last menstrual period was 2 weeks ago and normal.   The history is provided by the patient.  Emesis   This is a new problem. The current episode started 2 days ago. The problem occurs continuously. The problem has not changed since onset.The emesis has an appearance of stomach contents. There has been no fever. The fever has been present for less than 1 day. Associated symptoms include diarrhea. Pertinent negatives include no chills and no fever.    History reviewed. No pertinent past medical history.  There are no active problems to display for this patient.   History reviewed. No pertinent surgical history.  OB History    No data available       Home Medications    Prior to Admission medications   Medication Sig Start Date End Date Taking? Authorizing Provider  HYDROcodone-acetaminophen (NORCO) 5-325 MG tablet Take 1 tablet by mouth every 6 (six) hours as needed for severe pain. 07/17/15   Mercedes Street, PA-C  naproxen (NAPROSYN) 500 MG tablet Take 1 tablet (500 mg total) by mouth 2 (two) times daily as needed for mild pain, moderate pain or headache (TAKE WITH MEALS.). 07/17/15   Mercedes Street, PA-C    Family History No family history on file.  Social History Social History  Substance Use Topics  . Smoking status: Former Games developer  . Smokeless tobacco: Never Used  . Alcohol use No     Allergies   Pineapple   Review of Systems Review  of Systems  Constitutional: Negative for chills and fever.  Gastrointestinal: Positive for diarrhea and vomiting.  All other systems reviewed and are negative.    Physical Exam Updated Vital Signs BP 116/76   Pulse 86   Temp 98.2 F (36.8 C) (Oral)   Resp 20   Ht 5' 2.5" (1.588 m)   Wt 155 lb (70.3 kg)   LMP 12/03/2016   SpO2 99%   BMI 27.90 kg/m   Physical Exam  Constitutional: She is oriented to person, place, and time. She appears well-developed and well-nourished. No distress.  HENT:  Head: Normocephalic and atraumatic.  Mouth/Throat: Oropharynx is clear and moist. No oropharyngeal exudate.  Neck: Normal range of motion. Neck supple.  Cardiovascular: Normal rate and regular rhythm.  Exam reveals no gallop and no friction rub.   No murmur heard. Pulmonary/Chest: Effort normal and breath sounds normal. No respiratory distress. She has no wheezes.  Abdominal: Soft. Bowel sounds are normal. She exhibits no distension. There is no tenderness.  Musculoskeletal: Normal range of motion.  Neurological: She is alert and oriented to person, place, and time.  Skin: Skin is warm and dry. She is not diaphoretic.  Nursing note and vitals reviewed.    ED Treatments / Results  Labs (all labs ordered are listed, but only abnormal results are displayed) Labs Reviewed  URINALYSIS, ROUTINE W REFLEX MICROSCOPIC  PREGNANCY, URINE  BASIC METABOLIC PANEL  CBC WITH DIFFERENTIAL/PLATELET  EKG  EKG Interpretation None       Radiology No results found.  Procedures Procedures (including critical care time)  Medications Ordered in ED Medications  sodium chloride 0.9 % bolus 1,000 mL (not administered)  ketorolac (TORADOL) 30 MG/ML injection 30 mg (not administered)  ondansetron (ZOFRAN) injection 4 mg (not administered)  ondansetron (ZOFRAN-ODT) disintegrating tablet 4 mg (4 mg Oral Given 12/23/16 1126)     Initial Impression / Assessment and Plan / ED Course  I have  reviewed the triage vital signs and the nursing notes.  Pertinent labs & imaging results that were available during my care of the patient were reviewed by me and considered in my medical decision making (see chart for details).  Patient presents with nausea, vomiting, and diarrhea for the past 2 days. She appears well-hydrated and laboratory studies are reassuring. She was given 1 L of normal saline along with medication and is now feeling better. She will be discharged with Zofran, clear liquids, and return as needed.  Final Clinical Impressions(s) / ED Diagnoses   Final diagnoses:  None    New Prescriptions New Prescriptions   No medications on file     Geoffery Lyons, MD 12/23/16 1439

## 2016-12-23 NOTE — ED Triage Notes (Signed)
Vomiting and diarrhea since last night. She had a headache last night.
